# Patient Record
Sex: Male | Born: 1985
Health system: Southern US, Community
[De-identification: ages and names within clinical notes are randomized; demographics above are authoritative.]

---

## 2013-12-06 ENCOUNTER — Ambulatory Visit: Payer: 59 | Admitting: Family

## 2013-12-09 ENCOUNTER — Ambulatory Visit (INDEPENDENT_AMBULATORY_CARE_PROVIDER_SITE_OTHER): Payer: 59 | Admitting: Family

## 2013-12-09 ENCOUNTER — Encounter: Payer: Self-pay | Admitting: Family

## 2013-12-09 VITALS — BP 120/64 | HR 88 | Ht 71.0 in | Wt 170.0 lb

## 2013-12-09 DIAGNOSIS — Z0000 Encounter for general adult medical examination without abnormal findings: Secondary | ICD-10-CM

## 2013-12-09 DIAGNOSIS — Z113 Encounter for screening for infections with a predominantly sexual mode of transmission: Secondary | ICD-10-CM

## 2013-12-09 LAB — COMPREHENSIVE METABOLIC PANEL
ALT: 63 U/L — AB (ref 0–53)
AST: 40 U/L — AB (ref 0–37)
Albumin: 3.7 g/dL (ref 3.5–5.2)
Alkaline Phosphatase: 58 U/L (ref 39–117)
BILIRUBIN TOTAL: 0.4 mg/dL (ref 0.2–1.2)
BUN: 14 mg/dL (ref 6–23)
CO2: 27 mEq/L (ref 19–32)
CREATININE: 1.3 mg/dL (ref 0.4–1.5)
Calcium: 9.6 mg/dL (ref 8.4–10.5)
Chloride: 103 mEq/L (ref 96–112)
GFR: 85.71 mL/min (ref >60.00)
Glucose, Bld: 90 mg/dL (ref 70–99)
Potassium: 3.7 mEq/L (ref 3.5–5.1)
Sodium: 140 mEq/L (ref 135–145)
Total Protein: 7.5 g/dL (ref 6.0–8.3)

## 2013-12-09 LAB — POCT URINALYSIS DIPSTICK
BILIRUBIN UA: NEGATIVE
Blood, UA: NEGATIVE
GLUCOSE UA: NEGATIVE
Leukocytes, UA: NEGATIVE
Nitrite, UA: NEGATIVE
Protein, UA: NEGATIVE
SPEC GRAV UA: 1.02
Urobilinogen, UA: 0.2
pH, UA: 5.5

## 2013-12-09 LAB — CBC WITH DIFFERENTIAL/PLATELET
Basophils Absolute: 0 10*3/uL (ref 0.0–0.1)
Basophils Relative: 0.3 % (ref 0.0–3.0)
Eosinophils Absolute: 0.1 10*3/uL (ref 0.0–0.7)
Eosinophils Relative: 0.7 % (ref 0.0–5.0)
HEMATOCRIT: 43.5 % (ref 39.0–52.0)
HEMOGLOBIN: 14.2 g/dL (ref 13.0–17.0)
LYMPHS ABS: 2.2 10*3/uL (ref 0.7–4.0)
Lymphocytes Relative: 29.2 % (ref 12.0–46.0)
MCHC: 32.8 g/dL (ref 30.0–36.0)
MCV: 93.3 fl (ref 78.0–100.0)
MONOS PCT: 5.5 % (ref 3.0–12.0)
Monocytes Absolute: 0.4 10*3/uL (ref 0.1–1.0)
NEUTROS ABS: 4.8 10*3/uL (ref 1.4–7.7)
Neutrophils Relative %: 64.3 % (ref 43.0–77.0)
Platelets: 278 10*3/uL (ref 150.0–400.0)
RBC: 4.66 Mil/uL (ref 4.22–5.81)
RDW: 12.7 % (ref 11.5–15.5)
WBC: 7.5 10*3/uL (ref 4.0–10.5)

## 2013-12-09 LAB — LIPID PANEL
CHOLESTEROL: 176 mg/dL (ref 0–200)
HDL: 62.2 mg/dL (ref >39.00)
LDL CALC: 93 mg/dL (ref 0–99)
NonHDL: 113.8
TRIGLYCERIDES: 102 mg/dL (ref 0.0–149.0)
Total CHOL/HDL Ratio: 3
VLDL: 20.4 mg/dL (ref 0.0–40.0)

## 2013-12-09 LAB — TSH: TSH: 1.83 u[IU]/mL (ref 0.35–4.50)

## 2013-12-09 NOTE — Patient Instructions (Signed)
Testicular Self-Exam  A self-examination of your testicles involves looking at and feeling your testicles for abnormal lumps or swelling. Several things can cause swelling, lumps, or pain in your testicles. Some of these causes are:  · Injuries.  · Inflammation.  · Infection.  · Accumulation of fluids around your testicle (hydrocele).  · Twisted testicles (testicular torsion).  · Testicular cancer.  Self-examination of the testicles and groin areas may be advised if you are at risk for testicular cancer. Risks for testicular cancer include:  · An undescended testicle (cryptorchidism).  · A history of previous testicular cancer.  · A family history of testicular cancer.  The testicles are easiest to examine after warm baths or showers and are more difficult to examine when you are cold. This is because the muscles attached to the testicles retract and pull them up higher or into the abdomen.  Follow these steps while you are standing:  · Hold your penis away from your body.  · Roll one testicle between your thumb and forefinger, feeling the entire testicle.  · Roll the other testicle between your thumb and forefinger, feeling the entire testicle.  Feel for lumps, swelling, or discomfort. A normal testicle is egg shaped and feels firm. It is smooth and not tender. The spermatic cord can be felt as a firm spaghetti-like cord at the back of your testicle. It is also important to examine the crease between the front of your leg and your abdomen. Feel for any bumps that are tender. These could be enlarged lymph nodes.   Document Released: 05/12/2000 Document Revised: 10/06/2012 Document Reviewed: 07/26/2012  Exit[employer]® Patient Information ©2015 [employer]. This information is not intended to replace advice given to you by your health care provider. Make sure you discuss any questions you have with your health care provider.

## 2013-12-09 NOTE — Progress Notes (Signed)
Pre visit review using our clinic review tool, if applicable. No additional management support is needed unless otherwise documented below in the visit note. 

## 2013-12-09 NOTE — Progress Notes (Signed)
   Subjective:    Patient ID: Andrew Strickland, male    DOB: 1985/09/01, 28 y.o.   MRN: [id number]  HPI  28 year old AAM, nonsmoker is in today for a CPX. He is a Charity fundraiserN at [employer]. Exercises daily. Would like to be screened for HSV2.   Review of Systems  HENT: Negative.   Eyes: Negative.   Respiratory: Negative.   Cardiovascular: Negative.   Gastrointestinal: Negative.   Endocrine: Negative.   Genitourinary: Negative.   Musculoskeletal: Negative.   Skin: Negative.   Allergic/Immunologic: Negative.   Neurological: Negative.   Hematological: Negative.   Psychiatric/Behavioral: Negative.    History reviewed. No pertinent past medical history.  History   Social History  . Marital Status: Single    Spouse Name: N/A    Number of Children: N/A  . Years of Education: N/A   Occupational History  . Not on file.   Social History Main Topics  . Smoking status: Never Smoker   . Smokeless tobacco: Not on file  . Alcohol Use: No  . Drug Use: No  . Sexual Activity: Not on file   Other Topics Concern  . Not on file   Social History Narrative  . No narrative on file    History reviewed. No pertinent past surgical history.  No family history on file.  Allergies  Allergen Reactions  . Codeine   . Penicillins     No current outpatient prescriptions on file prior to visit.   No current facility-administered medications on file prior to visit.    BP 120/64  Pulse 88  Ht 5\' 11"  (1.803 m)  Wt 170 lb (77.111 kg)  BMI 23.72 kg/m2chart    Objective:   Physical Exam  Constitutional: He is oriented to person, place, and time. He appears well-developed and well-nourished.  HENT:  Head: Normocephalic.  Right Ear: External ear normal.  Left Ear: External ear normal.  Nose: Nose normal.  Mouth/Throat: Oropharynx is clear and moist.  Eyes: Conjunctivae and EOM are normal. Pupils are equal, round, and reactive to light.  Neck: Normal range of motion. Neck supple. No  thyromegaly present.  Cardiovascular: Normal rate, regular rhythm and normal heart sounds.   Pulmonary/Chest: Effort normal and breath sounds normal.  Abdominal: Soft. Bowel sounds are normal.  Genitourinary: Penis normal.  Musculoskeletal: Normal range of motion.  Neurological: He is alert and oriented to person, place, and time. He has normal reflexes.  Skin: Skin is warm and dry.  Psychiatric: He has a normal mood and affect.          Assessment & Plan:  Andrew Strickland was seen today for establish care.  Diagnoses and associated orders for this visit:  Preventative health care - CMP - CBC with Differential - TSH - POCT urinalysis dipstick - Lipid panel  Screen for STD (sexually transmitted disease) - HSV 2 Antibody, IgG

## 2013-12-12 LAB — HSV 2 ANTIBODY, IGG: HSV 2 Glycoprotein G Ab, IgG: <0.1 IV

## 2013-12-26 ENCOUNTER — Ambulatory Visit (INDEPENDENT_AMBULATORY_CARE_PROVIDER_SITE_OTHER): Payer: 59

## 2013-12-26 DIAGNOSIS — Z23 Encounter for immunization: Secondary | ICD-10-CM

## 2014-05-23 ENCOUNTER — Encounter (HOSPITAL_COMMUNITY): Payer: Self-pay | Admitting: Emergency Medicine

## 2014-05-23 ENCOUNTER — Emergency Department (HOSPITAL_COMMUNITY)
Admission: EM | Admit: 2014-05-23 | Discharge: 2014-05-23 | Disposition: A | Discharge status: Home / Self Care | Payer: 59 | Source: Home / Self Care | Attending: Family Medicine | Admitting: Family Medicine

## 2014-05-23 DIAGNOSIS — J111 Influenza due to unidentified influenza virus with other respiratory manifestations: Secondary | ICD-10-CM

## 2014-05-23 DIAGNOSIS — IMO0001 Reserved for inherently not codable concepts without codable children: Secondary | ICD-10-CM

## 2014-05-23 DIAGNOSIS — R030 Elevated blood-pressure reading, without diagnosis of hypertension: Secondary | ICD-10-CM | POA: Diagnosis not present

## 2014-05-23 DIAGNOSIS — R69 Illness, unspecified: Principal | ICD-10-CM

## 2014-05-23 MED ORDER — OSELTAMIVIR PHOSPHATE 75 MG PO CAPS: 75.0000 mg | ORAL_CAPSULE | Freq: Two times a day (BID) | ORAL | Status: DC

## 2014-05-23 NOTE — ED Provider Notes (Signed)
CSN: [id number]     Arrival date & time 05/23/14  1819 History   First MD Initiated Contact with Patient 05/23/14 1950     Chief Complaint  Patient presents with  . Influenza   (Consider location/radiation/quality/duration/timing/severity/associated sxs/prior Treatment) HPI Comments: Received flu shot for this season Nonsmoker Reports himself to be otherwise healthy PCP: Adline Mangoampbell, [place]  Patient is a 29 y.o. male presenting with flu symptoms. The history is provided by the patient.  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   History reviewed. No pertinent past medical history. History reviewed. No pertinent past surgical history. History reviewed. No pertinent family history. History  Substance Use Topics  . Smoking status: Never Smoker   . Smokeless tobacco: Never Used  . Alcohol Use: No    Review of Systems  Constitutional: Positive for fatigue.  HENT: Positive for congestion, rhinorrhea and sore throat.   Respiratory: Positive for cough. Negative for chest tightness and shortness of breath.   Cardiovascular: Negative.   Gastrointestinal: Negative.   Musculoskeletal: Positive for myalgias.  Neurological: Positive for headaches.  All other systems reviewed and are negative.   Allergies  Codeine and Penicillins  Home Medications   Prior to Admission medications   Medication Sig Start Date End Date Taking? Authorizing Provider  cetirizine (ZYRTEC) 10 MG tablet Take 10 mg by mouth daily.   Yes Historical Provider, MD  oseltamivir (TAMIFLU) 75 MG capsule Take 1 capsule  (75 mg total) by mouth every 12 (twelve) hours. 05/23/14   Jess BartersJennifer Lee H Amauri Medellin, PA   BP 159/96 mmHg  Pulse 67  Temp(Src) 98.3 F (36.8 C) (Oral)  Resp 20  SpO2 99% Physical Exam  Constitutional: He is oriented to person, place, and time. He appears well-developed and well-nourished. No distress.  HENT:  Head: Normocephalic and atraumatic.  Right Ear: Hearing, tympanic membrane, external ear and ear canal normal.  Left Ear: Hearing, tympanic membrane, external ear and ear canal normal.  Nose: Rhinorrhea present.  Mouth/Throat: Uvula is midline, oropharynx is clear and moist and mucous membranes are normal.  Eyes: Conjunctivae are normal. No scleral icterus.  Neck: Normal range of motion. Neck supple.  Cardiovascular: Normal rate, regular rhythm and normal heart sounds.   Pulmonary/Chest: Effort normal and breath sounds normal. No respiratory distress. He has no wheezes.  Musculoskeletal: Normal range of motion.  Lymphadenopathy:    He has no cervical adenopathy.  Neurological: He is alert and oriented to person, place, and time.  Skin: Skin is warm and dry.  Psychiatric: He has a normal mood and affect. His behavior is normal.  Nursing note and vitals reviewed.   ED Course  Procedures (including critical care time) Labs Review Labs Reviewed - No data to display  Imaging Review No results found.   MDM   1. Influenza-like illness   2. Elevated blood pressure   exam and VS reassuring Mild illness with no underlying co-morbidities Symptomatic care at home tamiflu as directed BP recheck with PCP once illness resolved.   Ria ClockJennifer Lee H Victor Granados, [place]PA 05/23/14 2025

## 2014-05-23 NOTE — ED Notes (Signed)
Pt was recently exposed to someone who tested positive for the flu.  He has been suffering from a cough, sneezing, nasal congestion, headache and sore throat for about 24 hours.  Pt denies any headache.

## 2014-05-23 NOTE — Discharge Instructions (Signed)
As we discussed, once your illness has resolved, please have your blood pressure re-checked as it was elevated at today's visit.  Influenza Influenza ("the flu") is a viral infection of the respiratory tract. It occurs more often in winter months because people spend more time in close contact with one another. Influenza can make you feel very sick. Influenza easily spreads from person to person (contagious). CAUSES  Influenza is caused by a virus that infects the respiratory tract. You can catch the virus by breathing in droplets from an infected person's cough or sneeze. You can also catch the virus by touching something that was recently contaminated with the virus and then touching your mouth, nose, or eyes. RISKS AND COMPLICATIONS You may be at risk for a more severe case of influenza if you smoke cigarettes, have diabetes, have chronic heart disease (such as heart failure) or lung disease (such as asthma), or if you have a weakened immune system. Elderly people and pregnant women are also at risk for more serious infections. The most common problem of influenza is a lung infection (pneumonia). Sometimes, this problem can require emergency medical care and may be life threatening. SIGNS AND SYMPTOMS  Symptoms typically last 4 to 10 days and may include:  Fever.  Chills.  Headache, body aches, and muscle aches.  Sore throat.  Chest discomfort and cough.  Poor appetite.  Weakness or feeling tired.  Dizziness.  Nausea or vomiting. DIAGNOSIS  Diagnosis of influenza is often made based on your history and a physical exam. A nose or throat swab test can be done to confirm the diagnosis. TREATMENT  In mild cases, influenza goes away on its own. Treatment is directed at relieving symptoms. For more severe cases, your health care provider may prescribe antiviral medicines to shorten the sickness. Antibiotic medicines are not effective because the infection is caused by a virus, not by  bacteria. HOME CARE INSTRUCTIONS  Take medicines only as directed by your health care provider.  Use a cool mist humidifier to make breathing easier.  Get plenty of rest until your temperature returns to normal. This usually takes 3 to 4 days.  Drink enough fluid to keep your urine clear or pale yellow.  Cover yourmouth and nosewhen coughing or sneezing,and wash your handswellto prevent thevirusfrom spreading.  Stay homefromwork orschool untilthe fever is gonefor at least 30full day. PREVENTION  An annual influenza vaccination (flu shot) is the best way to avoid getting influenza. An annual flu shot is now routinely recommended for all adults in the [place] SEEK MEDICAL CARE IF:  You experiencechest pain, yourcough worsens,or you producemore mucus.  Youhave nausea,vomiting, ordiarrhea.  Your fever returns or gets worse. SEEK IMMEDIATE MEDICAL CARE IF:  You havetrouble breathing, you become short of breath,or your skin ornails becomebluish.  You have severe painor stiffnessin the neck.  You develop a sudden headache, or pain in the face or ear.  You have nausea or vomiting that you cannot control. MAKE SURE YOU:   Understand these instructions.  Will watch your condition.  Will get help right away if you are not doing well or get worse. Document Released: 02/01/2000 Document Revised: 06/20/2013 Document Reviewed: 05/05/2011 [facility] Patient Information 2015 [place], [place]. This information is not intended to replace advice given to you by your health care provider. Make sure you discuss any questions you have with your health care provider.  Upper Respiratory Infection, Adult An upper respiratory infection (URI) is also sometimes known as the common cold. The  upper respiratory tract includes the nose, sinuses, throat, trachea, and bronchi. Bronchi are the airways leading to the lungs. Most people improve within 1 week, but symptoms can last up to 2  weeks. A residual cough may last even longer.  CAUSES Many different viruses can infect the tissues lining the upper respiratory tract. The tissues become irritated and inflamed and often become very moist. Mucus production is also common. A cold is contagious. You can easily spread the virus to others by oral contact. This includes kissing, sharing a glass, coughing, or sneezing. Touching your mouth or nose and then touching a surface, which is then touched by another person, can also spread the virus. SYMPTOMS  Symptoms typically develop 1 to 3 days after you come in contact with a cold virus. Symptoms vary from person to person. They may include:  Runny nose.  Sneezing.  Nasal congestion.  Sinus irritation.  Sore throat.  Loss of voice (laryngitis).  Cough.  Fatigue.  Muscle aches.  Loss of appetite.  Headache.  Low-grade fever. DIAGNOSIS  You might diagnose your own cold based on familiar symptoms, since most people get a cold 2 to 3 times a year. Your caregiver can confirm this based on your exam. Most importantly, your caregiver can check that your symptoms are not due to another disease such as strep throat, sinusitis, pneumonia, asthma, or epiglottitis. Blood tests, throat tests, and X-rays are not necessary to diagnose a common cold, but they may sometimes be helpful in excluding other more serious diseases. Your caregiver will decide if any further tests are required. RISKS AND COMPLICATIONS  You may be at risk for a more severe case of the common cold if you smoke cigarettes, have chronic heart disease (such as heart failure) or lung disease (such as asthma), or if you have a weakened immune system. The very young and very old are also at risk for more serious infections. Bacterial sinusitis, middle ear infections, and bacterial pneumonia can complicate the common cold. The common cold can worsen asthma and chronic obstructive pulmonary disease (COPD). Sometimes, these  complications can require emergency medical care and may be life-threatening. PREVENTION  The best way to protect against getting a cold is to practice good hygiene. Avoid oral or hand contact with people with cold symptoms. Wash your hands often if contact occurs. There is no clear evidence that vitamin C, vitamin E, echinacea, or exercise reduces the chance of developing a cold. However, it is always recommended to get plenty of rest and practice good nutrition. TREATMENT  Treatment is directed at relieving symptoms. There is no cure. Antibiotics are not effective, because the infection is caused by a virus, not by bacteria. Treatment may include:  Increased fluid intake. Sports drinks offer valuable electrolytes, sugars, and fluids.  Breathing heated mist or steam (vaporizer or shower).  Eating chicken soup or other clear broths, and maintaining good nutrition.  Getting plenty of rest.  Using gargles or lozenges for comfort.  Controlling fevers with ibuprofen or acetaminophen as directed by your caregiver.  Increasing usage of your inhaler if you have asthma. Zinc gel and zinc lozenges, taken in the first 24 hours of the common cold, can shorten the duration and lessen the severity of symptoms. Pain medicines may help with fever, muscle aches, and throat pain. A variety of non-prescription medicines are available to treat congestion and runny nose. Your caregiver can make recommendations and may suggest nasal or lung inhalers for other symptoms.  HOME CARE INSTRUCTIONS  Only take over-the-counter or prescription medicines for pain, discomfort, or fever as directed by your caregiver.  Use a warm mist humidifier or inhale steam from a shower to increase air moisture. This may keep secretions moist and make it easier to breathe.  Drink enough water and fluids to keep your urine clear or pale yellow.  Rest as needed.  Return to work when your temperature has returned to normal or as  your caregiver advises. You may need to stay home longer to avoid infecting others. You can also use a face mask and careful hand washing to prevent spread of the virus. SEEK MEDICAL CARE IF:   After the first few days, you feel you are getting worse rather than better.  You need your caregiver's advice about medicines to control symptoms.  You develop chills, worsening shortness of breath, or brown or red sputum. These may be signs of pneumonia.  You develop yellow or brown nasal discharge or pain in the face, especially when you bend forward. These may be signs of sinusitis.  You develop a fever, swollen neck glands, pain with swallowing, or white areas in the back of your throat. These may be signs of strep throat. SEEK IMMEDIATE MEDICAL CARE IF:   You have a fever.  You develop severe or persistent headache, ear pain, sinus pain, or chest pain.  You develop wheezing, a prolonged cough, cough up blood, or have a change in your usual mucus (if you have chronic lung disease).  You develop sore muscles or a stiff neck. Document Released: 07/30/2000 Document Revised: 04/28/2011 Document Reviewed: 05/11/2013 [facility] Patient Information 2015 [place], [place]. This information is not intended to replace advice given to you by your health care provider. Make sure you discuss any questions you have with your health care provider.

## 2014-06-15 ENCOUNTER — Encounter: Payer: Self-pay | Admitting: Podiatry

## 2014-06-15 ENCOUNTER — Ambulatory Visit (INDEPENDENT_AMBULATORY_CARE_PROVIDER_SITE_OTHER): Payer: 59 | Admitting: Podiatry

## 2014-06-15 ENCOUNTER — Ambulatory Visit (INDEPENDENT_AMBULATORY_CARE_PROVIDER_SITE_OTHER): Payer: 59

## 2014-06-15 VITALS — BP 120/76 | HR 66 | Resp 16

## 2014-06-15 DIAGNOSIS — M2142 Flat foot [pes planus] (acquired), left foot: Secondary | ICD-10-CM | POA: Diagnosis not present

## 2014-06-15 DIAGNOSIS — M79673 Pain in unspecified foot: Secondary | ICD-10-CM | POA: Diagnosis not present

## 2014-06-15 DIAGNOSIS — M2141 Flat foot [pes planus] (acquired), right foot: Secondary | ICD-10-CM | POA: Diagnosis not present

## 2014-06-15 DIAGNOSIS — M722 Plantar fascial fibromatosis: Secondary | ICD-10-CM | POA: Diagnosis not present

## 2014-06-15 NOTE — Progress Notes (Signed)
   Subjective:    Patient ID: Andrew Strickland, male    DOB: Dec 05, 1985, 29 y.o.   MRN: [id number]  HPI Comments: "I have pain in the feet"  Patient c/o aching plantar bilateral, especially arch, for several years. Has gotten worse over time. He has flat feet and has tried OTC insoles-initially helped but not anymore. Works 12 hr shifts.  Foot Pain      Review of Systems  All other systems reviewed and are negative.      Objective:   Physical Exam: I have reviewed his past medical history medications allergies surgery social history and review of systems. Pulses are strongly palpable bilateral. Neurologic sensorium is intact percent joint C monofilament. Deep tendon reflexes are intact bilaterally muscle strength +5 over 5 dorsiflexion plantar flexors and inverters everters all intrinsic musculature is intact. He does have flexible pes planus bilaterally with the foot appears to be in good rectus position with exception of mild gastroc equinus as well as a mild hallux valgus deformity. Radiographs confirm pes planus as well as hallux valgus no osteoarthritic changes as of yet. Cutaneous evaluation and history supple well-hydrated cutis no erythema or edema cellulitis drainage or odor.        Assessment & Plan:  Assessment: Pes planus/fasciitis/hallux valgus bilateral.  Plan: We discussed the etiology pathology conservative versus surgical therapies. At this point I believe the best thing for him would be a pair of custom build orthotics we will follow up with him once these are in our office.

## 2014-09-04 ENCOUNTER — Telehealth: Payer: Self-pay | Admitting: Podiatry

## 2014-09-04 NOTE — Telephone Encounter (Signed)
Patient wants to know if insurance has paid any on his orthotics that he was scanned for on 06/15/2014. Both Charity and I tried to read but could not figure out. I told him right now that he has a $0.00 balance. He requested that you call him please to let him know what is going on. Thank you.

## 2014-09-22 ENCOUNTER — Ambulatory Visit: Payer: 59 | Admitting: *Deleted

## 2014-09-22 DIAGNOSIS — M722 Plantar fascial fibromatosis: Secondary | ICD-10-CM

## 2014-09-22 NOTE — Progress Notes (Signed)
Patient ID: Andrew Strickland, male   DOB: July 30, 1985, 29 y.o.   MRN: [id number] Patient presents for orthotic pick up.  Verbal and written break in and wear instructions given.  Patient will follow up in 4 weeks if symptoms worsen or fail to improve.

## 2014-09-22 NOTE — Patient Instructions (Signed)

## 2014-10-17 ENCOUNTER — Ambulatory Visit: Payer: 59 | Admitting: Adult Health

## 2014-12-01 ENCOUNTER — Ambulatory Visit: Payer: 59 | Admitting: Adult Health

## 2014-12-18 ENCOUNTER — Other Ambulatory Visit (HOSPITAL_COMMUNITY)
Admission: RE | Admit: 2014-12-18 | Discharge: 2014-12-18 | Disposition: A | Discharge status: Home / Self Care | Payer: 59 | Source: Ambulatory Visit | Attending: Adult Health | Admitting: Adult Health

## 2014-12-18 ENCOUNTER — Encounter: Payer: Self-pay | Admitting: Adult Health

## 2014-12-18 ENCOUNTER — Ambulatory Visit (INDEPENDENT_AMBULATORY_CARE_PROVIDER_SITE_OTHER): Payer: 59 | Admitting: Adult Health

## 2014-12-18 VITALS — BP 124/76 | Temp 98.2°F | Ht 71.0 in | Wt 178.2 lb

## 2014-12-18 DIAGNOSIS — Z0000 Encounter for general adult medical examination without abnormal findings: Secondary | ICD-10-CM

## 2014-12-18 DIAGNOSIS — Z113 Encounter for screening for infections with a predominantly sexual mode of transmission: Secondary | ICD-10-CM | POA: Diagnosis not present

## 2014-12-18 LAB — CBC WITH DIFFERENTIAL/PLATELET
Basophils Absolute: 0 10*3/uL (ref 0.0–0.1)
Basophils Relative: 0.2 % (ref 0.0–3.0)
EOS PCT: 0.5 % (ref 0.0–5.0)
Eosinophils Absolute: 0 10*3/uL (ref 0.0–0.7)
HCT: 43.4 % (ref 39.0–52.0)
Hemoglobin: 14.3 g/dL (ref 13.0–17.0)
Lymphocytes Relative: 22.7 % (ref 12.0–46.0)
Lymphs Abs: 1.8 10*3/uL (ref 0.7–4.0)
MCHC: 33 g/dL (ref 30.0–36.0)
MCV: 92.2 fl (ref 78.0–100.0)
MONOS PCT: 5.7 % (ref 3.0–12.0)
Monocytes Absolute: 0.5 10*3/uL (ref 0.1–1.0)
NEUTROS ABS: 5.7 10*3/uL (ref 1.4–7.7)
Neutrophils Relative %: 70.9 % (ref 43.0–77.0)
Platelets: 270 10*3/uL (ref 150.0–400.0)
RBC: 4.7 Mil/uL (ref 4.22–5.81)
RDW: 12.6 % (ref 11.5–15.5)
WBC: 8.1 10*3/uL (ref 4.0–10.5)

## 2014-12-18 LAB — POCT URINALYSIS DIPSTICK
Bilirubin, UA: NEGATIVE
Blood, UA: NEGATIVE
Glucose, UA: NEGATIVE
Ketones, UA: NEGATIVE
LEUKOCYTES UA: NEGATIVE
NITRITE UA: NEGATIVE
Spec Grav, UA: >=1.03
UROBILINOGEN UA: 0.2
pH, UA: 6

## 2014-12-18 LAB — BASIC METABOLIC PANEL
BUN: 13 mg/dL (ref 6–23)
CO2: 27 meq/L (ref 19–32)
Calcium: 9.9 mg/dL (ref 8.4–10.5)
Chloride: 99 mEq/L (ref 96–112)
Creatinine, Ser: 1.05 mg/dL (ref 0.40–1.50)
GFR: 107.88 mL/min (ref >60.00)
GLUCOSE: 103 mg/dL — AB (ref 70–99)
POTASSIUM: 3.3 meq/L — AB (ref 3.5–5.1)
SODIUM: 138 meq/L (ref 135–145)

## 2014-12-18 LAB — HEPATIC FUNCTION PANEL
ALK PHOS: 58 U/L (ref 39–117)
ALT: 53 U/L (ref 0–53)
AST: 32 U/L (ref 0–37)
Albumin: 4.3 g/dL (ref 3.5–5.2)
Bilirubin, Direct: 0.1 mg/dL (ref 0.0–0.3)
Total Bilirubin: 0.4 mg/dL (ref 0.2–1.2)
Total Protein: 8.1 g/dL (ref 6.0–8.3)

## 2014-12-18 LAB — LIPID PANEL
CHOLESTEROL: 171 mg/dL (ref 0–200)
HDL: 63.2 mg/dL (ref >39.00)
LDL Cholesterol: 95 mg/dL (ref 0–99)
NonHDL: 107.92
Total CHOL/HDL Ratio: 3
Triglycerides: 67 mg/dL (ref 0.0–149.0)
VLDL: 13.4 mg/dL (ref 0.0–40.0)

## 2014-12-18 LAB — TSH: TSH: 1.3 u[IU]/mL (ref 0.35–4.50)

## 2014-12-18 LAB — HEMOGLOBIN A1C: Hgb A1c MFr Bld: 5.4 % (ref 4.6–6.5)

## 2014-12-18 NOTE — Patient Instructions (Addendum)
It was great meeting you today!  I will follow up with you regarding your blood work. Follow up with me in one year or sooner if needed.   Health Maintenance, Male A healthy lifestyle and preventative care can promote health and wellness.  Maintain regular health, dental, and eye exams.  Eat a healthy diet. Foods like vegetables, fruits, whole grains, low-fat dairy products, and lean protein foods contain the nutrients you need and are low in calories. Decrease your intake of foods high in solid fats, added sugars, and salt. Get information about a proper diet from your health care provider, if necessary.  Regular physical exercise is one of the most important things you can do for your health. Most adults should get at least 150 minutes of moderate-intensity exercise (any activity that increases your heart rate and causes you to sweat) each week. In addition, most adults need muscle-strengthening exercises on 2 or more days a week.   Maintain a healthy weight. The body mass index (BMI) is a screening tool to identify possible weight problems. It provides an estimate of body fat based on height and weight. Your health care provider can find your BMI and can help you achieve or maintain a healthy weight. For males 20 years and older:  A BMI below 18.5 is considered underweight.  A BMI of 18.5 to 24.9 is normal.  A BMI of 25 to 29.9 is considered overweight.  A BMI of 30 and above is considered obese.  Maintain normal blood lipids and cholesterol by exercising and minimizing your intake of saturated fat. Eat a balanced diet with plenty of fruits and vegetables. Blood tests for lipids and cholesterol should begin at age 28 and be repeated every 5 years. If your lipid or cholesterol levels are high, you are over age 28, or you are at high risk for heart disease, you may need your cholesterol levels checked more frequently.Ongoing high lipid and cholesterol levels should be treated with medicines  if diet and exercise are not working.  If you smoke, find out from your health care provider how to quit. If you do not use tobacco, do not start.  Lung cancer screening is recommended for adults aged 55-80 years who are at high risk for developing lung cancer because of a history of smoking. A yearly low-dose CT scan of the lungs is recommended for people who have at least a 30-pack-year history of smoking and are current smokers or have quit within the past 15 years. A pack year of smoking is smoking an average of 1 pack of cigarettes a day for 1 year (for example, a 30-pack-year history of smoking could mean smoking 1 pack a day for 30 years or 2 packs a day for 15 years). Yearly screening should continue until the smoker has stopped smoking for at least 15 years. Yearly screening should be stopped for people who develop a health problem that would prevent them from having lung cancer treatment.  If you choose to drink alcohol, do not have more than 2 drinks per day. One drink is considered to be 12 oz (360 mL) of beer, 5 oz (150 mL) of wine, or 1.5 oz (45 mL) of liquor.  Avoid the use of street drugs. Do not share needles with anyone. Ask for help if you need support or instructions about stopping the use of drugs.  High blood pressure causes heart disease and increases the risk of stroke. High blood pressure is more likely to develop in:  People who have blood pressure in the end of the normal range (100-139/85-89 mm Hg).  People who are overweight or obese.  People who are African American.  If you are 60-4 years of age, have your blood pressure checked every 3-5 years. If you are 65 years of age or older, have your blood pressure checked every year. You should have your blood pressure measured twice--once when you are at a hospital or clinic, and once when you are not at a hospital or clinic. Record the average of the two measurements. To check your blood pressure when you are not at a  hospital or clinic, you can use:  An automated blood pressure machine at a pharmacy.  A home blood pressure monitor.  If you are 12-49 years old, ask your health care provider if you should take aspirin to prevent heart disease.  Diabetes screening involves taking a blood sample to check your fasting blood sugar level. This should be done once every 3 years after age 42 if you are at a normal weight and without risk factors for diabetes. Testing should be considered at a younger age or be carried out more frequently if you are overweight and have at least 1 risk factor for diabetes.  Colorectal cancer can be detected and often prevented. Most routine colorectal cancer screening begins at the age of 56 and continues through age 41. However, your health care provider may recommend screening at an earlier age if you have risk factors for colon cancer. On a yearly basis, your health care provider may provide home test kits to check for hidden blood in the stool. A small camera at the end of a tube may be used to directly examine the colon (sigmoidoscopy or colonoscopy) to detect the earliest forms of colorectal cancer. Talk to your health care provider about this at age 84 when routine screening begins. A direct exam of the colon should be repeated every 5-10 years through age 60, unless early forms of precancerous polyps or small growths are found.  People who are at an increased risk for hepatitis B should be screened for this virus. You are considered at high risk for hepatitis B if:  You were born in a country where hepatitis B occurs often. Talk with your health care provider about which countries are considered high risk.  Your parents were born in a high-risk country and you have not received a shot to protect against hepatitis B (hepatitis B vaccine).  You have HIV or AIDS.  You use needles to inject street drugs.  You live with, or have sex with, someone who has hepatitis B.  You are a  man who has sex with other men (MSM).  You get hemodialysis treatment.  You take certain medicines for conditions like cancer, organ transplantation, and autoimmune conditions.  Hepatitis C blood testing is recommended for all people born from [birth year] through 1965 and any individual with known risk factors for hepatitis C.  Healthy men should no longer receive prostate-specific antigen (PSA) blood tests as part of routine cancer screening. Talk to your health care provider about prostate cancer screening.  Testicular cancer screening is not recommended for adolescents or adult males who have no symptoms. Screening includes self-exam, a health care provider exam, and other screening tests. Consult with your health care provider about any symptoms you have or any concerns you have about testicular cancer.  Practice safe sex. Use condoms and avoid high-risk sexual practices to reduce the spread of  sexually transmitted infections (STIs).  You should be screened for STIs, including gonorrhea and chlamydia if:  You are sexually active and are younger than 24 years.  You are older than 24 years, and your health care provider tells you that you are at risk for this type of infection.  Your sexual activity has changed since you were last screened, and you are at an increased risk for chlamydia or gonorrhea. Ask your health care provider if you are at risk.  If you are at risk of being infected with HIV, it is recommended that you take a prescription medicine daily to prevent HIV infection. This is called pre-exposure prophylaxis (PrEP). You are considered at risk if:  You are a man who has sex with other men (MSM).  You are a heterosexual man who is sexually active with multiple partners.  You take drugs by injection.  You are sexually active with a partner who has HIV.  Talk with your health care provider about whether you are at high risk of being infected with HIV. If you choose to begin PrEP,  you should first be tested for HIV. You should then be tested every 3 months for as long as you are taking PrEP.  Use sunscreen. Apply sunscreen liberally and repeatedly throughout the day. You should seek shade when your shadow is shorter than you. Protect yourself by wearing long sleeves, pants, a wide-brimmed hat, and sunglasses year round whenever you are outdoors.  Tell your health care provider of new moles or changes in moles, especially if there is a change in shape or color. Also, tell your health care provider if a mole is larger than the size of a pencil eraser.  A one-time screening for abdominal aortic aneurysm (AAA) and surgical repair of large AAAs by ultrasound is recommended for men aged 78-75 years who are current or former smokers.  Stay current with your vaccines (immunizations).   This information is not intended to replace advice given to you by your health care provider. Make sure you discuss any questions you have with your health care provider.   Document Released: 08/02/2007 Document Revised: 02/24/2014 Document Reviewed: 07/01/2010 Elsevier Interactive Patient Education [employer].

## 2014-12-18 NOTE — Progress Notes (Signed)
Pre visit review using our clinic review tool, if applicable. No additional management support is needed unless otherwise documented below in the visit note. 

## 2014-12-18 NOTE — Progress Notes (Signed)
HPI:  Andrew Strickland is here to establish care and for his complete physical. He  has no past medical history on file. He is a extremely healthy AA male. 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  Immunizations:UTD  Diet: Chicken and fish Exercise:3-4 days a week in the gym   No past medical history on file.  No past surgical history on file.  No family history on file.  Social History   Social History  . Marital Status: Single    Spouse Name: N/A  . Number of Children: N/A  . Years of Education: N/A   Social History Main Topics  . Smoking status: Never Smoker   . Smokeless tobacco: Never Used  . Alcohol Use: No  . Drug Use: No  . Sexual Activity: Not Asked   Other Topics Concern  . None   Social History Narrative     Current outpatient prescriptions:  .  cetirizine (ZYRTEC) 10 MG tablet, Take 10 mg by mouth daily., Disp: , Rfl:  .  Doxycycline Monohydrate (VIBRAMYCIN PO), Take by mouth., Disp: , Rfl:  .  clindamycin (CLEOCIN T) 1 % external solution, , Disp: , Rfl: 11 .  tretinoin (RETIN-A) 0.025 % cream, , Disp: , Rfl: 5  EXAM:  Filed Vitals:   12/18/14 0848  BP: 124/76  Temp: 98.2 F (36.8 C)    Body mass index is 24.86 kg/(m^2).  GENERAL: vitals reviewed and listed above, alert, oriented, appears well hydrated and in no acute distress  HEENT: atraumatic, conjunttiva clear, no obvious abnormalities on inspection of external  nose and ears  NECK: Neck is soft and supple without masses, no adenopathy or thyromegaly, trachea midline, no JVD. Normal range of motion.   LUNGS: clear to auscultation bilaterally, no wheezes, rales or rhonchi, good air movement  CV: Regular rate and rhythm, normal S1/S2, no audible murmurs, gallops, or rubs. No carotid bruit and no peripheral edema.   MS: moves all extremities without noticeable abnormality. No edema noted  Abd: soft/nontender/nondistended/normal bowel sounds   Skin: warm and dry, no rash   Extremities: No clubbing, cyanosis, or edema. Capillary refill is WNL. Pulses intact bilaterally in upper and lower extremities.   Neuro: CN II-XII intact, sensation and reflexes normal throughout, 5/5 muscle strength in bilateral upper and lower extremities. Normal finger to nose. Normal rapid alternating movements. Normal romberg. No pronator drift.   PSYCH: pleasant and cooperative, no obvious depression or anxiety  ASSESSMENT AND PLAN: 1. Routine general medical examination at a health care facility - Basic metabolic panel - CBC with Differential/Platelet - Hemoglobin A1c - Hepatic function panel - Lipid panel - POCT urinalysis dipstick - TSH - Acute Hep Panel & Hep B Surface Ab - HIV antibody - HSV(herpes smplx)abs-1+2(IgG+IgM)-bld - RPR - Follow up with me in one year or sooner if needed.  - Urine cytology ancillary only   -We reviewed the PMH, PSH, FH, SH, Meds and Allergies. -We provided refills for any medications we will prescribe as needed. -We addressed  current concerns per orders and patient instructions. -We have asked for records for pertinent exams, studies, vaccines and notes from previous providers. -We have advised patient to follow up per instructions below.   -Patient advised to return or notify a provider immediately if symptoms worsen or persist or new concerns arise.    Shirline Frees, AGNP

## 2014-12-19 LAB — HSV(HERPES SMPLX)ABS-I+II(IGG+IGM)-BLD
HERPES SIMPLEX VRS I-IGM AB (EIA): 4.61 {index} — AB
HSV 1 Glycoprotein G Ab, IgG: 6.13 IV — ABNORMAL HIGH
HSV 2 Glycoprotein G Ab, IgG: 0.12 IV

## 2014-12-19 LAB — ACUTE HEP PANEL AND HEP B SURFACE AB
HCV Ab: NEGATIVE
HEP B C IGM: NONREACTIVE
HEP B S AB: POSITIVE — AB
HEP B S AG: NEGATIVE
Hep A IgM: NONREACTIVE

## 2014-12-19 LAB — URINE CYTOLOGY ANCILLARY ONLY
CHLAMYDIA, DNA PROBE: NEGATIVE
NEISSERIA GONORRHEA: NEGATIVE
TRICH (WINDOWPATH): NEGATIVE

## 2014-12-19 LAB — RPR

## 2014-12-19 LAB — HIV ANTIBODY (ROUTINE TESTING W REFLEX): HIV: NONREACTIVE

## 2015-02-26 MED FILL — CLINDAMYCIN PH 1% SOLUTION: 1 | 30 days supply | Qty: 60 | Fill #6

## 2015-03-16 DIAGNOSIS — H5213 Myopia, bilateral: Secondary | ICD-10-CM | POA: Diagnosis not present

## 2015-03-28 DIAGNOSIS — Z79899 Other long term (current) drug therapy: Secondary | ICD-10-CM | POA: Diagnosis not present

## 2015-03-28 DIAGNOSIS — L700 Acne vulgaris: Secondary | ICD-10-CM | POA: Diagnosis not present

## 2015-03-28 DIAGNOSIS — L731 Pseudofolliculitis barbae: Secondary | ICD-10-CM | POA: Diagnosis not present

## 2015-03-28 DIAGNOSIS — L810 Postinflammatory hyperpigmentation: Secondary | ICD-10-CM | POA: Diagnosis not present

## 2015-03-28 MED FILL — MYORISAN 40 MG CAPSULE: 40 | 30 days supply | Qty: 60 | Fill #0

## 2015-04-04 MED FILL — CLINDAMYCIN PH 1% SOLUTION: 1 | 30 days supply | Qty: 60 | Fill #7

## 2015-05-07 MED FILL — CLINDAMYCIN PH 1% SOLUTION: 1 | 30 days supply | Qty: 60 | Fill #8

## 2015-05-11 DIAGNOSIS — L739 Follicular disorder, unspecified: Secondary | ICD-10-CM | POA: Diagnosis not present

## 2015-05-11 DIAGNOSIS — Z79899 Other long term (current) drug therapy: Secondary | ICD-10-CM | POA: Diagnosis not present

## 2015-05-11 DIAGNOSIS — L731 Pseudofolliculitis barbae: Secondary | ICD-10-CM | POA: Diagnosis not present

## 2015-05-11 DIAGNOSIS — L700 Acne vulgaris: Secondary | ICD-10-CM | POA: Diagnosis not present

## 2015-05-11 DIAGNOSIS — T50995A Adverse effect of other drugs, medicaments and biological substances, initial encounter: Secondary | ICD-10-CM | POA: Diagnosis not present

## 2015-05-11 DIAGNOSIS — L810 Postinflammatory hyperpigmentation: Secondary | ICD-10-CM | POA: Diagnosis not present

## 2015-05-11 MED FILL — MYORISAN 40 MG CAPSULE: 40 | 30 days supply | Qty: 60 | Fill #0

## 2015-08-13 DIAGNOSIS — L731 Pseudofolliculitis barbae: Secondary | ICD-10-CM | POA: Diagnosis not present

## 2015-08-13 DIAGNOSIS — L810 Postinflammatory hyperpigmentation: Secondary | ICD-10-CM | POA: Diagnosis not present

## 2015-08-13 DIAGNOSIS — Z79899 Other long term (current) drug therapy: Secondary | ICD-10-CM | POA: Diagnosis not present

## 2015-08-13 DIAGNOSIS — L700 Acne vulgaris: Secondary | ICD-10-CM | POA: Diagnosis not present

## 2015-08-13 DIAGNOSIS — Z5181 Encounter for therapeutic drug level monitoring: Secondary | ICD-10-CM | POA: Diagnosis not present

## 2015-08-13 MED FILL — CLINDAMYCIN PH 1% SOLUTION: 1 | 30 days supply | Qty: 120 | Fill #0

## 2015-08-13 MED FILL — MYORISAN 40 MG CAPSULE: 40 | 30 days supply | Qty: 60 | Fill #0

## 2015-09-25 DIAGNOSIS — Z79899 Other long term (current) drug therapy: Secondary | ICD-10-CM | POA: Diagnosis not present

## 2015-09-25 DIAGNOSIS — L700 Acne vulgaris: Secondary | ICD-10-CM | POA: Diagnosis not present

## 2015-10-01 MED FILL — MYORISAN 40 MG CAPSULE: 40 | 30 days supply | Qty: 60 | Fill #0

## 2015-10-25 DIAGNOSIS — L700 Acne vulgaris: Secondary | ICD-10-CM | POA: Diagnosis not present

## 2015-10-25 DIAGNOSIS — Z79899 Other long term (current) drug therapy: Secondary | ICD-10-CM | POA: Diagnosis not present

## 2015-10-26 MED FILL — MYORISAN 40 MG CAPSULE: 40 | 30 days supply | Qty: 60 | Fill #0

## 2015-11-23 DIAGNOSIS — Z79899 Other long term (current) drug therapy: Secondary | ICD-10-CM | POA: Diagnosis not present

## 2015-11-23 DIAGNOSIS — L700 Acne vulgaris: Secondary | ICD-10-CM | POA: Diagnosis not present

## 2015-11-23 DIAGNOSIS — L731 Pseudofolliculitis barbae: Secondary | ICD-10-CM | POA: Diagnosis not present

## 2015-11-23 MED FILL — MYORISAN 40 MG CAPSULE: 40 | 30 days supply | Qty: 60 | Fill #0

## 2015-12-05 MED FILL — CLINDAMYCIN PH 1% SOLUTION: 1 | 30 days supply | Qty: 60 | Fill #0

## 2015-12-27 DIAGNOSIS — L700 Acne vulgaris: Secondary | ICD-10-CM | POA: Diagnosis not present

## 2015-12-27 DIAGNOSIS — Z79899 Other long term (current) drug therapy: Secondary | ICD-10-CM | POA: Diagnosis not present

## 2015-12-27 MED FILL — MYORISAN 40 MG CAPSULE: 40 | 30 days supply | Qty: 60 | Fill #0

## 2016-01-03 MED FILL — CLINDAMYCIN PH 1% SOLUTION: 1 | 30 days supply | Qty: 60 | Fill #1

## 2016-06-03 MED FILL — CLINDAMYCIN PH 1% SOLUTION: 1 | 30 days supply | Qty: 60 | Fill #2

## 2016-08-07 ENCOUNTER — Ambulatory Visit (INDEPENDENT_AMBULATORY_CARE_PROVIDER_SITE_OTHER): Payer: No Typology Code available for payment source | Admitting: Adult Health

## 2016-08-07 ENCOUNTER — Other Ambulatory Visit (HOSPITAL_COMMUNITY)
Admission: RE | Admit: 2016-08-07 | Discharge: 2016-08-07 | Disposition: A | Discharge status: Home / Self Care | Payer: No Typology Code available for payment source | Source: Ambulatory Visit | Attending: Adult Health | Admitting: Adult Health

## 2016-08-07 ENCOUNTER — Encounter: Payer: Self-pay | Admitting: Adult Health

## 2016-08-07 VITALS — BP 138/80 | Temp 98.6°F | Ht 71.0 in | Wt 189.3 lb

## 2016-08-07 DIAGNOSIS — Z0000 Encounter for general adult medical examination without abnormal findings: Secondary | ICD-10-CM | POA: Diagnosis not present

## 2016-08-07 DIAGNOSIS — Z202 Contact with and (suspected) exposure to infections with a predominantly sexual mode of transmission: Secondary | ICD-10-CM | POA: Diagnosis not present

## 2016-08-07 LAB — LIPID PANEL
CHOL/HDL RATIO: 3
CHOLESTEROL: 139 mg/dL (ref 0–200)
HDL: 53.7 mg/dL (ref >39.00)
LDL Cholesterol: 76 mg/dL (ref 0–99)
NonHDL: 84.86
TRIGLYCERIDES: 43 mg/dL (ref 0.0–149.0)
VLDL: 8.6 mg/dL (ref 0.0–40.0)

## 2016-08-07 LAB — HEPATIC FUNCTION PANEL
ALBUMIN: 4.5 g/dL (ref 3.5–5.2)
ALK PHOS: 63 U/L (ref 39–117)
ALT: 41 U/L (ref 0–53)
AST: 28 U/L (ref 0–37)
Bilirubin, Direct: 0.1 mg/dL (ref 0.0–0.3)
TOTAL PROTEIN: 7.5 g/dL (ref 6.0–8.3)
Total Bilirubin: 0.5 mg/dL (ref 0.2–1.2)

## 2016-08-07 LAB — CBC WITH DIFFERENTIAL/PLATELET
Basophils Absolute: 0 10*3/uL (ref 0.0–0.1)
Basophils Relative: 0.1 % (ref 0.0–3.0)
EOS ABS: 0 10*3/uL (ref 0.0–0.7)
Eosinophils Relative: 0.5 % (ref 0.0–5.0)
HEMATOCRIT: 43.8 % (ref 39.0–52.0)
HEMOGLOBIN: 14.7 g/dL (ref 13.0–17.0)
LYMPHS PCT: 27.4 % (ref 12.0–46.0)
Lymphs Abs: 1.8 10*3/uL (ref 0.7–4.0)
MCHC: 33.5 g/dL (ref 30.0–36.0)
MCV: 90.7 fl (ref 78.0–100.0)
MONO ABS: 0.4 10*3/uL (ref 0.1–1.0)
Monocytes Relative: 6.3 % (ref 3.0–12.0)
Neutro Abs: 4.3 10*3/uL (ref 1.4–7.7)
Neutrophils Relative %: 65.7 % (ref 43.0–77.0)
Platelets: 267 10*3/uL (ref 150.0–400.0)
RBC: 4.83 Mil/uL (ref 4.22–5.81)
RDW: 12.2 % (ref 11.5–15.5)
WBC: 6.6 10*3/uL (ref 4.0–10.5)

## 2016-08-07 LAB — BASIC METABOLIC PANEL
BUN: 13 mg/dL (ref 6–23)
CO2: 28 mEq/L (ref 19–32)
CREATININE: 1.12 mg/dL (ref 0.40–1.50)
Calcium: 9.8 mg/dL (ref 8.4–10.5)
Chloride: 102 mEq/L (ref 96–112)
GFR: 99 mL/min (ref >60.00)
Glucose, Bld: 97 mg/dL (ref 70–99)
POTASSIUM: 4.1 meq/L (ref 3.5–5.1)
Sodium: 137 mEq/L (ref 135–145)

## 2016-08-07 NOTE — Progress Notes (Signed)
Subjective:    Patient ID: Andrew Strickland, male    DOB: 1985/10/23, 31 y.o.   MRN: [id number]  HPI  Patient presents for yearly preventative medicine examination. He is a pleasant 31 year old male who  has no past medical history on file.  All immunizations and health maintenance protocols were reviewed with the patient and needed orders were placed.  Appropriate screening laboratory values were ordered for the patient including screening of hyperlipidemia, renal function and hepatic function.  Medication reconciliation,  past medical history, social history, problem list and allergies were reviewed in detail with the patient  Goals were established with regard to weight loss, exercise, and  diet in compliance with medications. He eats healthy and exercises frequently.   His only acute concern is that of possible syphilis. He reports that he first noticed a " chancre on the head of my penis" on June 2nd 2018. He denies any body rashes, fevers, or feeling ill. 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   HENT: Negative.   Eyes: Negative.   Respiratory: Negative.   Cardiovascular: Negative.   Gastrointestinal: Negative.   Endocrine: Negative.   Genitourinary: Negative.   Musculoskeletal: Negative.   Skin: Positive for color change.  Allergic/Immunologic: Negative.   Neurological: Negative.   Hematological: Negative.   Psychiatric/Behavioral: Negative.   All other systems reviewed and are negative.  No past medical history on file.  Social History   Social History  . Marital status: Single    Spouse name: N/A  . Number of children: N/A  . Years of education: N/A   Occupational History  . Not on file.   Social History Main Topics  . Smoking status: Never Smoker  . Smokeless tobacco: Never Used  . Alcohol use No  . Drug use: No    . Sexual activity: Yes    Birth control/ protection: Condom   Other Topics Concern  . 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  Family History  Problem Relation Age of Onset  . Family history unknown: Yes    Allergies  Allergen Reactions  . Codeine   . Penicillins     Current Outpatient Prescriptions on File Prior to Visit  Medication Sig Dispense Refill  . cetirizine (ZYRTEC) 10 MG tablet Take 10 mg by mouth daily.    . clindamycin (CLEOCIN T) 1 % external solution   11  . tretinoin (RETIN-A) 0.025 % cream   5   No current facility-administered medications on file prior to visit.     BP 138/80 (BP Location: Left Arm, Patient Position: Sitting, Cuff Size: Normal)   Temp 98.6 F (37 C) (Oral)   Ht 5\' 11"  (1.803 m)   Wt 189 lb 4.8 oz (85.9 kg)   BMI 26.40 kg/m       Objective:   Physical Exam  Constitutional: He is oriented to person, place, and time. He appears well-developed and well-nourished. No distress.  HENT:  Head: Normocephalic and atraumatic.  Right Ear: External ear normal.  Left Ear: External ear normal.  Nose: Nose normal.  Mouth/Throat: Oropharynx is clear and moist. No oropharyngeal exudate.  Eyes: Conjunctivae and EOM are normal. Pupils are equal, round, and reactive to light. Right eye exhibits no discharge. Left eye exhibits no discharge. No scleral icterus.  Neck: Normal range of motion. Neck supple. No JVD present. No tracheal deviation present. No thyromegaly present.  Cardiovascular: Normal rate, regular rhythm, normal heart sounds and intact distal pulses.  Exam reveals no gallop and no friction rub.   No murmur heard. Pulmonary/Chest: Effort normal and breath sounds normal. No stridor. No respiratory distress. He has no wheezes. He has no rales. He exhibits no tenderness.  Abdominal: Soft. Bowel sounds are normal. He exhibits no  distension and no mass. There is no tenderness. There is no rebound and no guarding.  Genitourinary:    Circumcised.  Musculoskeletal: Normal range of motion. He exhibits no edema, tenderness or deformity.  Lymphadenopathy:    He has no cervical adenopathy.  Neurological: He is alert and oriented to person, place, and time. No cranial nerve deficit. Coordination normal.  Skin: Skin is warm and dry. No rash noted. He is not diaphoretic. No erythema. No pallor.  Psychiatric: He has a normal mood and affect. Judgment and thought content normal.  Nursing note and vitals reviewed.      Assessment & Plan:  1. Routine general medical examination at a health care facility - Healthy male. Benign exam  - Basic metabolic panel - CBC with Differential/Platelet - Lipid panel - Hepatic function panel  2. Possible exposure to STD - Does not appear as syphilis. Papule appears more as molluscum. Patient was agreeable to cryotherapy  - Acute Hep Panel & Hep B Surface Ab - HIV antibody - Urine cytology ancillary only - RPR - HSV(herpes smplx)abs-1+2(IgG+IgM)-bld - Follow up as needed   Shirline Freesory Ember Gottwald, NP

## 2016-08-08 LAB — URINE CYTOLOGY ANCILLARY ONLY
CHLAMYDIA, DNA PROBE: NEGATIVE
Neisseria Gonorrhea: NEGATIVE
Trichomonas: NEGATIVE

## 2016-08-08 LAB — ACUTE HEP PANEL AND HEP B SURFACE AB
HCV Ab: NEGATIVE
HEP A IGM: NONREACTIVE
HEP B S AB: POSITIVE — AB
Hep B C IgM: NONREACTIVE
Hepatitis B Surface Ag: NEGATIVE

## 2016-08-08 LAB — HIV ANTIBODY (ROUTINE TESTING W REFLEX): HIV: NONREACTIVE

## 2016-08-08 LAB — RPR

## 2016-08-09 ENCOUNTER — Encounter: Payer: Self-pay | Admitting: Adult Health

## 2016-08-12 LAB — HSV(HERPES SMPLX)ABS-I+II(IGG+IGM)-BLD
HSV 1 Glycoprotein G Ab, IgG: 43.6 index — ABNORMAL HIGH (ref 0.00–0.90)
HSV 2 Glycoprotein G Ab, IgG: <0.91 index (ref 0.00–0.90)
HSVI/II Comb IgM: 0.97 Ratio — ABNORMAL HIGH (ref 0.00–0.90)

## 2016-10-01 MED FILL — CLINDAMYCIN PH 1% SOLUTION: 1 | 30 days supply | Qty: 60 | Fill #3

## 2016-11-07 ENCOUNTER — Encounter: Payer: Self-pay | Admitting: Adult Health

## 2017-06-24 ENCOUNTER — Encounter: Payer: Self-pay | Admitting: Adult Health

## 2017-06-24 ENCOUNTER — Ambulatory Visit (INDEPENDENT_AMBULATORY_CARE_PROVIDER_SITE_OTHER): Payer: BLUE CROSS/BLUE SHIELD | Admitting: Adult Health

## 2017-06-24 ENCOUNTER — Other Ambulatory Visit: Payer: Self-pay | Admitting: Adult Health

## 2017-06-24 VITALS — BP 126/74 | Temp 98.2°F | Ht 70.5 in | Wt 190.0 lb

## 2017-06-24 DIAGNOSIS — Z0000 Encounter for general adult medical examination without abnormal findings: Secondary | ICD-10-CM

## 2017-06-24 LAB — HEPATIC FUNCTION PANEL
ALBUMIN: 4.4 g/dL (ref 3.5–5.2)
ALK PHOS: 66 U/L (ref 39–117)
ALT: 36 U/L (ref 0–53)
AST: 28 U/L (ref 0–37)
BILIRUBIN DIRECT: 0.1 mg/dL (ref 0.0–0.3)
BILIRUBIN TOTAL: 0.3 mg/dL (ref 0.2–1.2)
TOTAL PROTEIN: 8 g/dL (ref 6.0–8.3)

## 2017-06-24 LAB — CBC WITH DIFFERENTIAL/PLATELET
Basophils Absolute: 0 10*3/uL (ref 0.0–0.1)
Basophils Relative: 0.3 % (ref 0.0–3.0)
EOS ABS: 0 10*3/uL (ref 0.0–0.7)
EOS PCT: 0.4 % (ref 0.0–5.0)
HCT: 41.5 % (ref 39.0–52.0)
HEMOGLOBIN: 14.3 g/dL (ref 13.0–17.0)
LYMPHS ABS: 1.9 10*3/uL (ref 0.7–4.0)
Lymphocytes Relative: 28 % (ref 12.0–46.0)
MCHC: 34.4 g/dL (ref 30.0–36.0)
MCV: 90.8 fl (ref 78.0–100.0)
MONO ABS: 0.4 10*3/uL (ref 0.1–1.0)
Monocytes Relative: 6.3 % (ref 3.0–12.0)
NEUTROS ABS: 4.3 10*3/uL (ref 1.4–7.7)
NEUTROS PCT: 65 % (ref 43.0–77.0)
PLATELETS: 361 10*3/uL (ref 150.0–400.0)
RBC: 4.57 Mil/uL (ref 4.22–5.81)
RDW: 12.4 % (ref 11.5–15.5)
WBC: 6.6 10*3/uL (ref 4.0–10.5)

## 2017-06-24 LAB — LIPID PANEL
CHOL/HDL RATIO: 3
Cholesterol: 163 mg/dL (ref 0–200)
HDL: 48.3 mg/dL (ref >39.00)
LDL Cholesterol: 100 mg/dL — ABNORMAL HIGH (ref 0–99)
NONHDL: 114.24
Triglycerides: 72 mg/dL (ref 0.0–149.0)
VLDL: 14.4 mg/dL (ref 0.0–40.0)

## 2017-06-24 LAB — BASIC METABOLIC PANEL
BUN: 21 mg/dL (ref 6–23)
CHLORIDE: 102 meq/L (ref 96–112)
CO2: 27 meq/L (ref 19–32)
CREATININE: 1.24 mg/dL (ref 0.40–1.50)
Calcium: 9.8 mg/dL (ref 8.4–10.5)
GFR: 87.52 mL/min (ref >60.00)
GLUCOSE: 79 mg/dL (ref 70–99)
POTASSIUM: 4.2 meq/L (ref 3.5–5.1)
Sodium: 137 mEq/L (ref 135–145)

## 2017-06-24 LAB — TSH: TSH: 1.34 u[IU]/mL (ref 0.35–4.50)

## 2017-06-24 NOTE — Progress Notes (Signed)
Subjective:    Patient ID: Andrew Strickland, male    DOB: 02/18/1985, 31 y.o.   MRN: [id number]  HPI  Patient presents for yearly preventative medicine examination. He is a pleasant and very health 31 year old male who  has no past medical history on file.  All immunizations and health maintenance protocols were reviewed with the patient and needed orders were placed. UTD on vaccinations   Appropriate screening laboratory values were ordered for the patient including screening of hyperlipidemia, renal function and hepatic function.  Medication reconciliation,  past medical history, social history, problem list and allergies were reviewed in detail with the patient  Goals were established with regard to weight loss, exercise, and  diet in compliance with medications. He eats healthy and exercises on a routine basis.   He has no acute complaints.   Review of Systems  Constitutional: Negative.   HENT: Negative.   Eyes: Negative.   Respiratory: Negative.   Cardiovascular: Negative.   Gastrointestinal: Negative.   Endocrine: Negative.   Genitourinary: Negative.   Musculoskeletal: Negative.   Skin: Negative.   Allergic/Immunologic: Negative.   Neurological: Negative.   Hematological: Negative.   Psychiatric/Behavioral: Negative.   All other systems reviewed and are negative.  History reviewed. No pertinent past medical history.  Social History   Socioeconomic History  . Marital status: Single    Spouse name: Not on file  . Number of children: Not on file  . Years of education: Not on file  . Highest education level: Not on file  Occupational History  . Not on file  Social Needs  . Financial resource strain: Not on file  . Food insecurity:    Worry: Not on file    Inability: Not on file  . Transportation needs:    Medical: Not on file    Non-medical: Not on file  Tobacco Use  . Smoking status: Never Smoker  . Smokeless tobacco: Never Used  Substance and Sexual  Activity  . Alcohol use: No    Alcohol/week: 0.0 oz  . Drug use: No  . Sexual activity: Yes    Birth control/protection: Condom  Lifestyle  . Physical activity:    Days per week: Not on file    Minutes per session: Not on file  . Stress: Not on file  Relationships  . Social connections:    Talks on phone: Not on file    Gets together: Not on file    Attends religious service: Not on file    Active member of club or organization: Not on file    Attends meetings of clubs or organizations: Not on file    Relationship status: Not on file  . Intimate partner violence:    Fear of current or ex partner: Not on file    Emotionally abused: Not on file    Physically abused: Not on file    Forced sexual activity: Not on file  Other Topics Concern  . 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 No pertinent surgical history.  Family History  Family history unknown: Yes    Allergies  Allergen Reactions  . Codeine   . Penicillins     Current Outpatient Medications on File Prior to Visit  Medication Sig Dispense Refill  . cetirizine (ZYRTEC) 10 MG tablet Take 10 mg by mouth daily.    Marland Kitchen  clindamycin (CLEOCIN T) 1 % external solution   11  . tretinoin (RETIN-A) 0.025 % cream   5   No current facility-administered medications on file prior to visit.     BP 126/74   Temp 98.2 F (36.8 C) (Oral)   Ht 5' 10.5" (1.791 m) Comment: WITH SHOES  Wt 190 lb (86.2 kg)   BMI 26.88 kg/m       Objective:   Physical Exam  Constitutional: He is oriented to person, place, and time. He appears well-developed and well-nourished. No distress.  HENT:  Head: Normocephalic and atraumatic.  Right Ear: External ear normal.  Left Ear: External ear normal.  Nose: Nose normal.  Mouth/Throat: Oropharynx is clear and moist. No oropharyngeal exudate.  Eyes: Pupils are equal, round, and reactive to light. Conjunctivae and  EOM are normal. Right eye exhibits no discharge. Left eye exhibits no discharge. No scleral icterus.  Neck: No JVD present. No tracheal deviation present. No thyromegaly present.  Cardiovascular: Normal rate, regular rhythm, normal heart sounds and intact distal pulses. Exam reveals no gallop and no friction rub.  No murmur heard. Pulmonary/Chest: Effort normal and breath sounds normal. No respiratory distress. He has no wheezes. He has no rales. He exhibits no tenderness.  Abdominal: Soft. Bowel sounds are normal. He exhibits no distension and no mass. There is no tenderness. There is no rebound and no guarding. No hernia.  Musculoskeletal: Normal range of motion. He exhibits no edema, tenderness or deformity.  Lymphadenopathy:    He has no cervical adenopathy.  Neurological: He is alert and oriented to person, place, and time. He displays normal reflexes. No cranial nerve deficit or sensory deficit. He exhibits normal muscle tone. Coordination normal.  Skin: Skin is warm and dry. Capillary refill takes less than 2 seconds. No rash noted. He is not diaphoretic. No erythema. No pallor.  Psychiatric: He has a normal mood and affect. His behavior is normal. Judgment and thought content normal.  Nursing note and vitals reviewed.     Assessment & Plan:  1. Routine general medical examination at a health care facility - Benign exam. Healthy young man  - Basic metabolic panel - CBC with Differential/Platelet - Hepatic function panel - Lipid panel - TSH  Shirline Frees, NP

## 2017-08-21 ENCOUNTER — Encounter: Payer: Self-pay | Admitting: Family Medicine

## 2017-08-21 ENCOUNTER — Ambulatory Visit (INDEPENDENT_AMBULATORY_CARE_PROVIDER_SITE_OTHER): Payer: BLUE CROSS/BLUE SHIELD | Admitting: Family Medicine

## 2017-08-21 VITALS — BP 130/81 | HR 67 | Ht 71.0 in | Wt 192.0 lb

## 2017-08-21 DIAGNOSIS — M25512 Pain in left shoulder: Secondary | ICD-10-CM | POA: Insufficient documentation

## 2017-08-21 NOTE — Progress Notes (Signed)
PCP: Shirline Frees, NP  Subjective:   HPI: Patient is a 32 y.o. male here for left shoulder pain.  Patient reports a year ago he felt like he slept on his left shoulder wrong but didn't sustain an acute injury. Seemed to improve from this. Then in April while training he noticed when doing overhead presses his left shoulder fatigues faster and he develops an achy, throbbing pain posterior left scapular area. Pain worse if he tries to sleep on left side and at night. No numbness, skin changes. Tried bodyworks, cupping treatments - help temporarily. No remote injury either. Pain level 1/10 currently. Right handed.  History reviewed. No pertinent past medical history.  Current Outpatient Medications on File Prior to Visit  Medication Sig Dispense Refill  . cetirizine (ZYRTEC) 10 MG tablet Take 10 mg by mouth daily.    . clindamycin (CLEOCIN T) 1 % external solution   11  . tretinoin (RETIN-A) 0.025 % cream   5   No current facility-administered medications on file prior to visit.     History reviewed. No pertinent surgical history.  Allergies  Allergen Reactions  . Codeine   . Penicillins     Social History   Socioeconomic History  . Marital status: Single    Spouse name: Not on file  . Number of children: Not on file  . Years of education: Not on file  . Highest education level: Not on file  Occupational History  . Not on file  Social Needs  . Financial resource strain: Not on file  . Food insecurity:    Worry: Not on file    Inability: Not on file  . Transportation needs:    Medical: Not on file    Non-medical: Not on file  Tobacco Use  . Smoking status: Never Smoker  . Smokeless tobacco: Never Used  Substance and Sexual Activity  . Alcohol use: No    Alcohol/week: 0.0 oz  . Drug use: No  . Sexual activity: Yes    Birth control/protection: Condom  Lifestyle  . Physical activity:    Days per week: Not on file    Minutes per session: Not on file  .  Stress: Not on file  Relationships  . Social connections:    Talks on phone: Not on file    Gets together: Not on file    Attends religious service: Not on file    Active member of club or organization: Not on file    Attends meetings of clubs or organizations: Not on file    Relationship status: Not on file  . Intimate partner violence:    Fear of current or ex partner: Not on file    Emotionally abused: Not on file    Physically abused: Not on file    Forced sexual activity: Not on file  Other Topics Concern  . 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     Objective:  Physical Exam:  Gen: NAD, comfortable in exam room  Left shoulder: No swelling, ecchymoses.  No gross deformity. No TTP. FROM. Negative Hawkins, Neers. Negative Yergasons. Strength 5/5  with empty can and resisted internal/external rotation.  Pain empty can. Negative apprehension. Positive o'briens. NV intact distally.  Right shoulder: No swelling, ecchymoses.  No gross deformity. No TTP. FROM. Strength 5/5 with empty can and resisted internal/external rotation. NV intact distally.   Assessment & Plan:  1. Left shoulder pain - concerning for labral tear with possible paralabral cyst.  Exam otherwise reassuring.  No atrophy or evidence of suprascapular nerve impingement.  Start physical therapy with labral tear protocol.  Tylenol, icing only if needed.  F/u in 6 weeks.  Consider MRI arthrogram if not improving.

## 2017-08-21 NOTE — Patient Instructions (Signed)
Your history and exam are consistent with a labral tear of your shoulder. Start physical therapy and do home exercises on days you don't go to therapy. They will review a progression of exercises - in the meantime I'd avoid overhead presses, incline bench - use pain as your guide for other exercises. Tylenol, icing only if needed. Follow up with me in 6 weeks. If not more than 50% improved by that point I'd consider an MRI arthrogram.

## 2017-08-21 NOTE — Assessment & Plan Note (Signed)
concerning for labral tear with possible paralabral cyst.  Exam otherwise reassuring.  No atrophy or evidence of suprascapular nerve impingement.  Start physical therapy with labral tear protocol.  Tylenol, icing only if needed.  F/u in 6 weeks.  Consider MRI arthrogram if not improving.

## 2017-10-02 ENCOUNTER — Ambulatory Visit: Payer: BLUE CROSS/BLUE SHIELD | Admitting: Family Medicine

## 2018-02-08 DIAGNOSIS — M25512 Pain in left shoulder: Secondary | ICD-10-CM | POA: Insufficient documentation

## 2018-04-09 ENCOUNTER — Encounter: Payer: Managed Care, Other (non HMO) | Admitting: Adult Health

## 2018-04-21 ENCOUNTER — Ambulatory Visit (INDEPENDENT_AMBULATORY_CARE_PROVIDER_SITE_OTHER): Payer: Managed Care, Other (non HMO) | Admitting: Adult Health

## 2018-04-21 ENCOUNTER — Ambulatory Visit (INDEPENDENT_AMBULATORY_CARE_PROVIDER_SITE_OTHER): Payer: Managed Care, Other (non HMO)

## 2018-04-21 ENCOUNTER — Encounter: Payer: Self-pay | Admitting: Adult Health

## 2018-04-21 VITALS — BP 122/70 | Temp 98.1°F | Wt 202.0 lb

## 2018-04-21 DIAGNOSIS — Z01818 Encounter for other preprocedural examination: Secondary | ICD-10-CM

## 2018-04-21 DIAGNOSIS — Z111 Encounter for screening for respiratory tuberculosis: Secondary | ICD-10-CM | POA: Diagnosis not present

## 2018-04-21 LAB — BASIC METABOLIC PANEL
BUN: 14 mg/dL (ref 6–23)
CO2: 30 mEq/L (ref 19–32)
CREATININE: 1.24 mg/dL (ref 0.40–1.50)
Calcium: 9.9 mg/dL (ref 8.4–10.5)
Chloride: 101 mEq/L (ref 96–112)
GFR: 81.9 mL/min (ref >60.00)
Glucose, Bld: 88 mg/dL (ref 70–99)
Potassium: 4.4 mEq/L (ref 3.5–5.1)
Sodium: 138 mEq/L (ref 135–145)

## 2018-04-21 LAB — PROTIME-INR
INR: 1.1 ratio — AB (ref 0.8–1.0)
PROTHROMBIN TIME: 13.2 s — AB (ref 9.6–13.1)

## 2018-04-21 LAB — CBC WITH DIFFERENTIAL/PLATELET
Basophils Absolute: 0 10*3/uL (ref 0.0–0.1)
Basophils Relative: 0.2 % (ref 0.0–3.0)
EOS PCT: 0.3 % (ref 0.0–5.0)
Eosinophils Absolute: 0 10*3/uL (ref 0.0–0.7)
HEMATOCRIT: 42.2 % (ref 39.0–52.0)
Hemoglobin: 14.4 g/dL (ref 13.0–17.0)
LYMPHS PCT: 14.8 % (ref 12.0–46.0)
Lymphs Abs: 1.2 10*3/uL (ref 0.7–4.0)
MCHC: 34 g/dL (ref 30.0–36.0)
MCV: 90.8 fl (ref 78.0–100.0)
MONOS PCT: 10.1 % (ref 3.0–12.0)
Monocytes Absolute: 0.8 10*3/uL (ref 0.1–1.0)
NEUTROS PCT: 74.6 % (ref 43.0–77.0)
Neutro Abs: 6.1 10*3/uL (ref 1.4–7.7)
PLATELETS: 259 10*3/uL (ref 150.0–400.0)
RBC: 4.65 Mil/uL (ref 4.22–5.81)
RDW: 12 % (ref 11.5–15.5)
WBC: 8.1 10*3/uL (ref 4.0–10.5)

## 2018-04-21 NOTE — Patient Instructions (Signed)
It was great seeing you today.

## 2018-04-21 NOTE — Progress Notes (Signed)
Subjective:    Patient ID: Andrew Strickland, male    DOB: 1985-06-09, 33 y.o.   MRN: [id number]  HPI 33 year old male who  has no past medical history on file.  He presents to the office today for pre surgical clearance. He will be having having a labral tear in the left shoulder repaired on 04/27/2018.   He also needs a PPD done for school clinicals   He has no acute complains or concerns   Review of Systems See HPI   History reviewed. No pertinent past medical history.  Social History   Socioeconomic History  . Marital status: Single    Spouse name: Not on file  . Number of children: Not on file  . Years of education: Not on file  . Highest education level: Not on file  Occupational History  . Not on file  Social Needs  . Financial resource strain: Not on file  . Food insecurity:    Worry: Not on file    Inability: Not on file  . Transportation needs:    Medical: Not on file    Non-medical: Not on file  Tobacco Use  . Smoking status: Never Smoker  . Smokeless tobacco: Never Used  Substance and Sexual Activity  . Alcohol use: No    Alcohol/week: 0.0 standard drinks  . Drug use: No  . Sexual activity: Yes    Birth control/protection: Condom  Lifestyle  . Physical activity:    Days per week: Not on file    Minutes per session: Not on file  . Stress: Not on file  Relationships  . Social connections:    Talks on phone: Not on file    Gets together: Not on file    Attends religious service: Not on file    Active member of club or organization: Not on file    Attends meetings of clubs or organizations: Not on file    Relationship status: Not on file  . Intimate partner violence:    Fear of current or ex partner: Not on file    Emotionally abused: Not on file    Physically abused: Not on file    Forced sexual activity: Not on file  Other Topics Concern  . 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 No pertinent surgical history.  Family History  Family history unknown: Yes    Allergies  Allergen Reactions  . Codeine   . Penicillins     Current Outpatient Medications on File Prior to Visit  Medication Sig Dispense Refill  . cetirizine (ZYRTEC) 10 MG tablet Take 10 mg by mouth daily.    . clindamycin (CLEOCIN T) 1 % external solution   11  . tretinoin (RETIN-A) 0.025 % cream   5   No current facility-administered medications on file prior to visit.     BP 122/70   Temp 98.1 F (36.7 C)   Wt 202 lb (91.6 kg)   BMI 28.17 kg/m       Objective:   Physical Exam Vitals signs and nursing note reviewed.  Constitutional:      Appearance: Normal appearance.  Cardiovascular:     Rate and Rhythm: Normal rate and regular rhythm.     Pulses: Normal pulses.     Heart sounds: Normal heart sounds.  Pulmonary:  Effort: Pulmonary effort is normal.     Breath sounds: Normal breath sounds.  Abdominal:     General: Abdomen is flat. Bowel sounds are normal.     Palpations: Abdomen is soft.  Skin:    General: Skin is warm and dry.     Capillary Refill: Capillary refill takes less than 2 seconds.  Neurological:     General: No focal deficit present.     Mental Status: He is alert and oriented to person, place, and time.  Psychiatric:        Mood and Affect: Mood normal.        Behavior: Behavior normal.        Thought Content: Thought content normal.        Judgment: Judgment normal.       Assessment & Plan:  1. Pre-operative clearance - healthy male. Cleared for surgery  - CBC with Differential/Platelet - Basic Metabolic Panel - DG Chest 2 View; Future - Protime-INR  Shirline Frees, NP

## 2018-04-23 ENCOUNTER — Encounter: Payer: Self-pay | Admitting: Family Medicine

## 2018-04-23 LAB — TB SKIN TEST: TB SKIN TEST: NEGATIVE

## 2018-04-23 NOTE — Addendum Note (Signed)
Addended by: Raj Janus T on: 04/23/2018 02:13 PM   Modules accepted: Orders

## 2018-04-23 NOTE — Addendum Note (Signed)
Addended by: Raj Janus T on: 04/23/2018 02:52 PM   Modules accepted: Orders

## 2018-04-28 ENCOUNTER — Encounter: Payer: Self-pay | Admitting: Adult Health

## 2018-04-28 ENCOUNTER — Other Ambulatory Visit: Payer: Self-pay | Admitting: Adult Health

## 2018-04-28 DIAGNOSIS — Z111 Encounter for screening for respiratory tuberculosis: Secondary | ICD-10-CM

## 2018-04-29 ENCOUNTER — Other Ambulatory Visit: Payer: Self-pay

## 2018-04-29 ENCOUNTER — Other Ambulatory Visit (INDEPENDENT_AMBULATORY_CARE_PROVIDER_SITE_OTHER): Payer: Managed Care, Other (non HMO)

## 2018-04-29 DIAGNOSIS — Z111 Encounter for screening for respiratory tuberculosis: Secondary | ICD-10-CM

## 2018-05-01 LAB — QUANTIFERON-TB GOLD PLUS
Mitogen-NIL: 7.58 IU/mL
NIL: 0.03 [IU]/mL
QuantiFERON-TB Gold Plus: NEGATIVE
TB1-NIL: 0.06 IU/mL
TB2-NIL: 0.13 IU/mL

## 2018-05-02 ENCOUNTER — Encounter: Payer: Self-pay | Admitting: Adult Health

## 2018-05-03 ENCOUNTER — Encounter: Payer: Self-pay | Admitting: Family Medicine

## 2018-07-19 ENCOUNTER — Other Ambulatory Visit: Payer: Self-pay

## 2018-07-19 ENCOUNTER — Ambulatory Visit (INDEPENDENT_AMBULATORY_CARE_PROVIDER_SITE_OTHER): Payer: Managed Care, Other (non HMO)

## 2018-07-19 ENCOUNTER — Ambulatory Visit: Payer: Managed Care, Other (non HMO) | Admitting: Podiatry

## 2018-07-19 ENCOUNTER — Other Ambulatory Visit: Payer: Self-pay | Admitting: Podiatry

## 2018-07-19 ENCOUNTER — Encounter: Payer: Self-pay | Admitting: Podiatry

## 2018-07-19 VITALS — Temp 98.1°F

## 2018-07-19 DIAGNOSIS — M79671 Pain in right foot: Secondary | ICD-10-CM

## 2018-07-19 DIAGNOSIS — M79672 Pain in left foot: Secondary | ICD-10-CM

## 2018-07-19 DIAGNOSIS — M779 Enthesopathy, unspecified: Secondary | ICD-10-CM

## 2018-07-21 NOTE — Progress Notes (Signed)
Subjective:   Patient ID: Andrew Strickland, male   DOB: 33 y.o.   MRN: [id number]   HPI Patient presents with flatfoot deformity bilateral with history of orthotics which were beneficial and is lost his last pair and started developed increased discomfort.  Patient does not smoke likes to be active and states that this does affect his ability to be active without any kind of support   Review of Systems  All other systems reviewed and are negative.       Objective:  Physical Exam Vitals signs and nursing note reviewed.  Constitutional:      Appearance: He is well-developed.  Pulmonary:     Effort: Pulmonary effort is normal.  Musculoskeletal: Normal range of motion.  Skin:    General: Skin is warm.  Neurological:     Mental Status: He is alert.     Neurovascular status found to be intact muscle strength is adequate range of motion was within normal limits with moderate flatfoot deformity and stress on the posterior tibial tendon bilateral.  Patient has good digital perfusion well oriented x3     Assessment:  Chronic foot structural changes creating stress against both feet     Plan:  H&P x-rays reviewed and recommended new orthotics and casted for functional orthotics utilizing ped orthotist for this.  Patient will be seen back when ready  X-rays indicate that the patient's structure looks good with no indications of pathology

## 2018-08-02 ENCOUNTER — Other Ambulatory Visit: Payer: Self-pay

## 2018-08-02 ENCOUNTER — Ambulatory Visit: Payer: Managed Care, Other (non HMO) | Admitting: Orthotics

## 2018-08-02 DIAGNOSIS — M79671 Pain in right foot: Secondary | ICD-10-CM

## 2018-08-02 DIAGNOSIS — M779 Enthesopathy, unspecified: Secondary | ICD-10-CM

## 2018-08-02 NOTE — Progress Notes (Signed)
Patient came in today to p/up functional foot orthotics.   The orthotics were assessed to both fit and function.  The F/O addressed the biomechanical issues/pathologies as intended, offering good longitudinal arch support, proper offloading, and foot support. There weren't any signs of discomfort or irritation.  The F/O fit properly in footwear with minimal trimming/adjustments. 

## 2019-06-27 ENCOUNTER — Ambulatory Visit (INDEPENDENT_AMBULATORY_CARE_PROVIDER_SITE_OTHER): Payer: 59 | Admitting: Orthotics

## 2019-06-27 ENCOUNTER — Other Ambulatory Visit: Payer: Self-pay

## 2019-06-27 DIAGNOSIS — M79672 Pain in left foot: Secondary | ICD-10-CM

## 2019-06-27 DIAGNOSIS — M79671 Pain in right foot: Secondary | ICD-10-CM | POA: Diagnosis not present

## 2019-06-27 NOTE — Progress Notes (Signed)
Repeating order for cmfo.  Dress this time.

## 2020-09-27 IMAGING — DX DG CHEST 2V
2 series · 2 of 2 positions shown · non-contrast
Comparison: None.

CLINICAL DATA: Preop clearance.

EXAM:
CHEST - 2 VIEW

[chest pa]
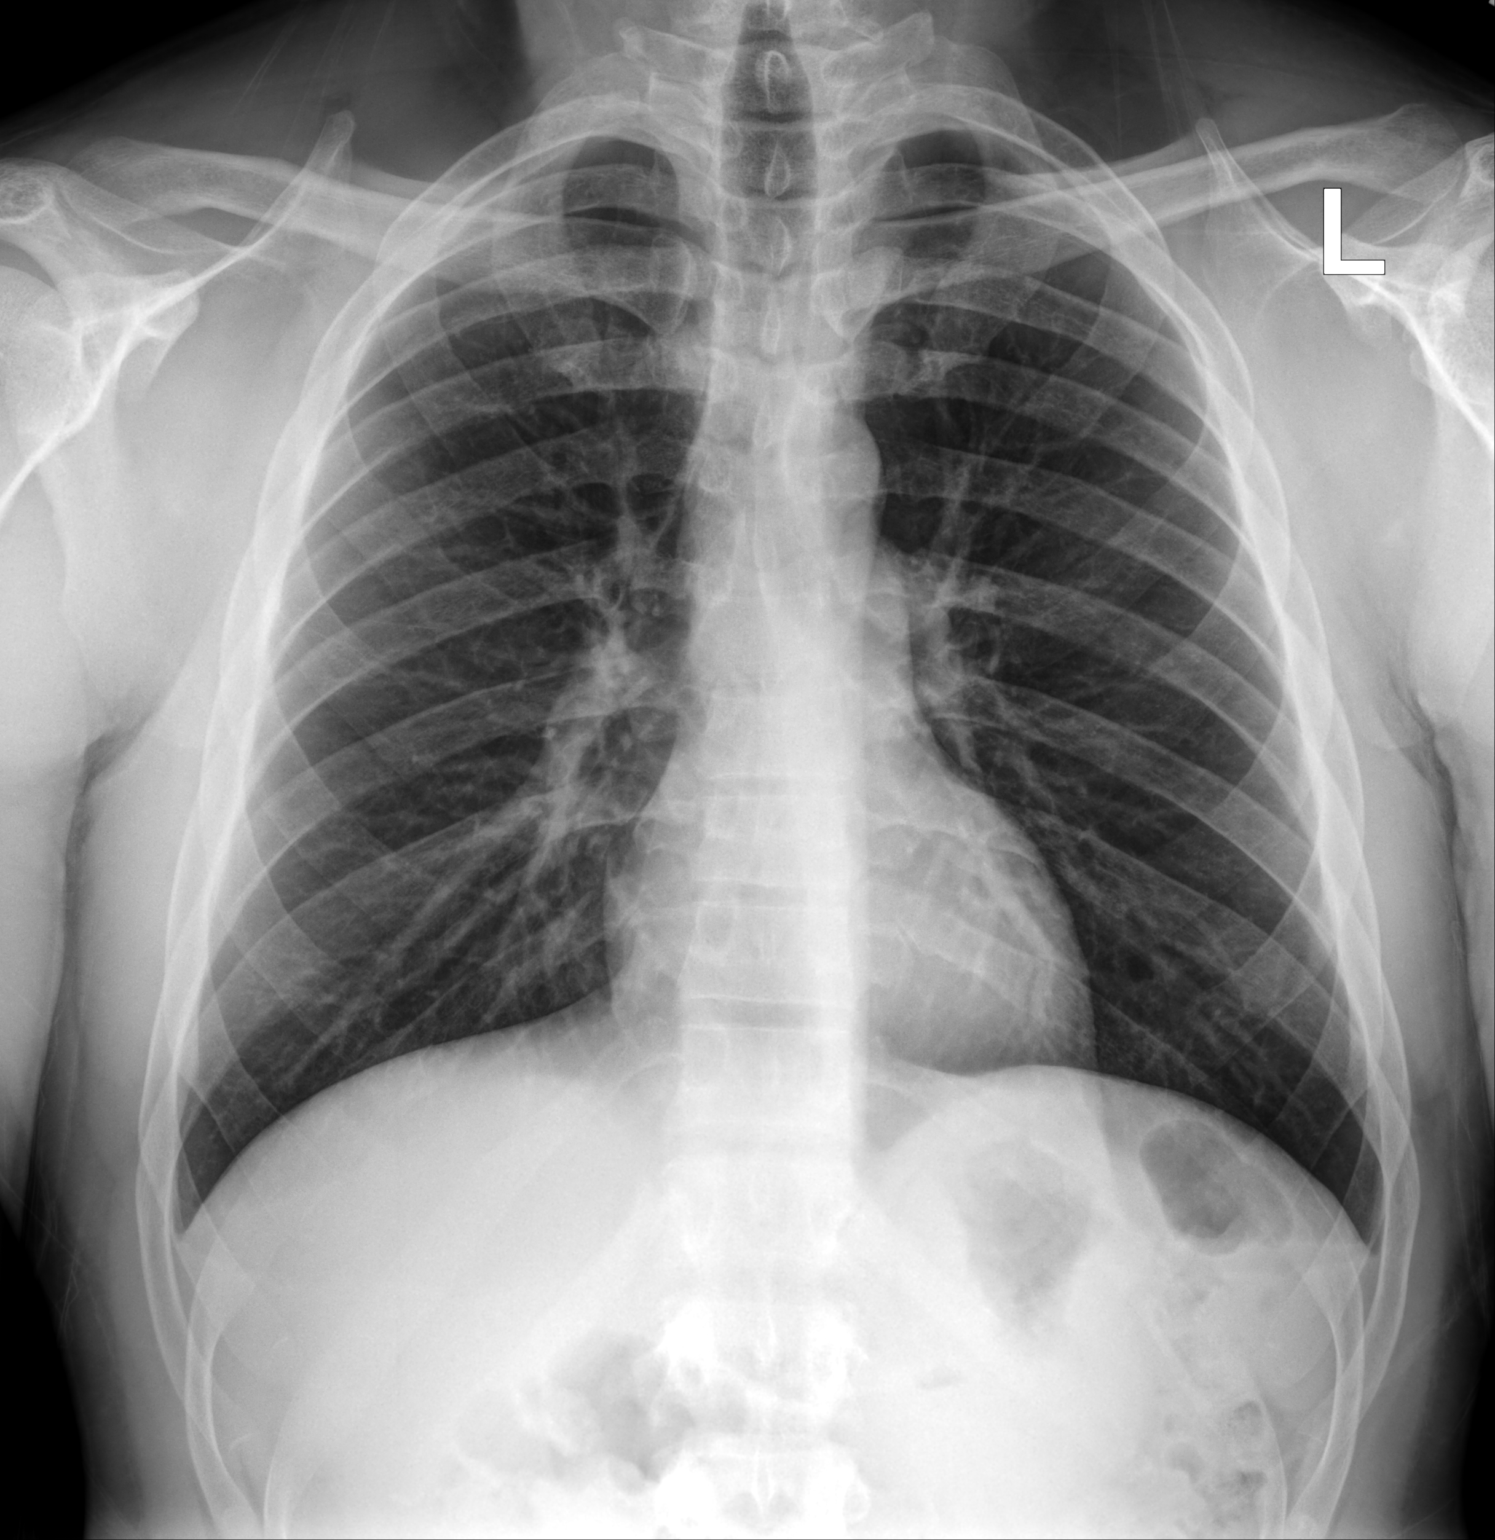

[chest lat]
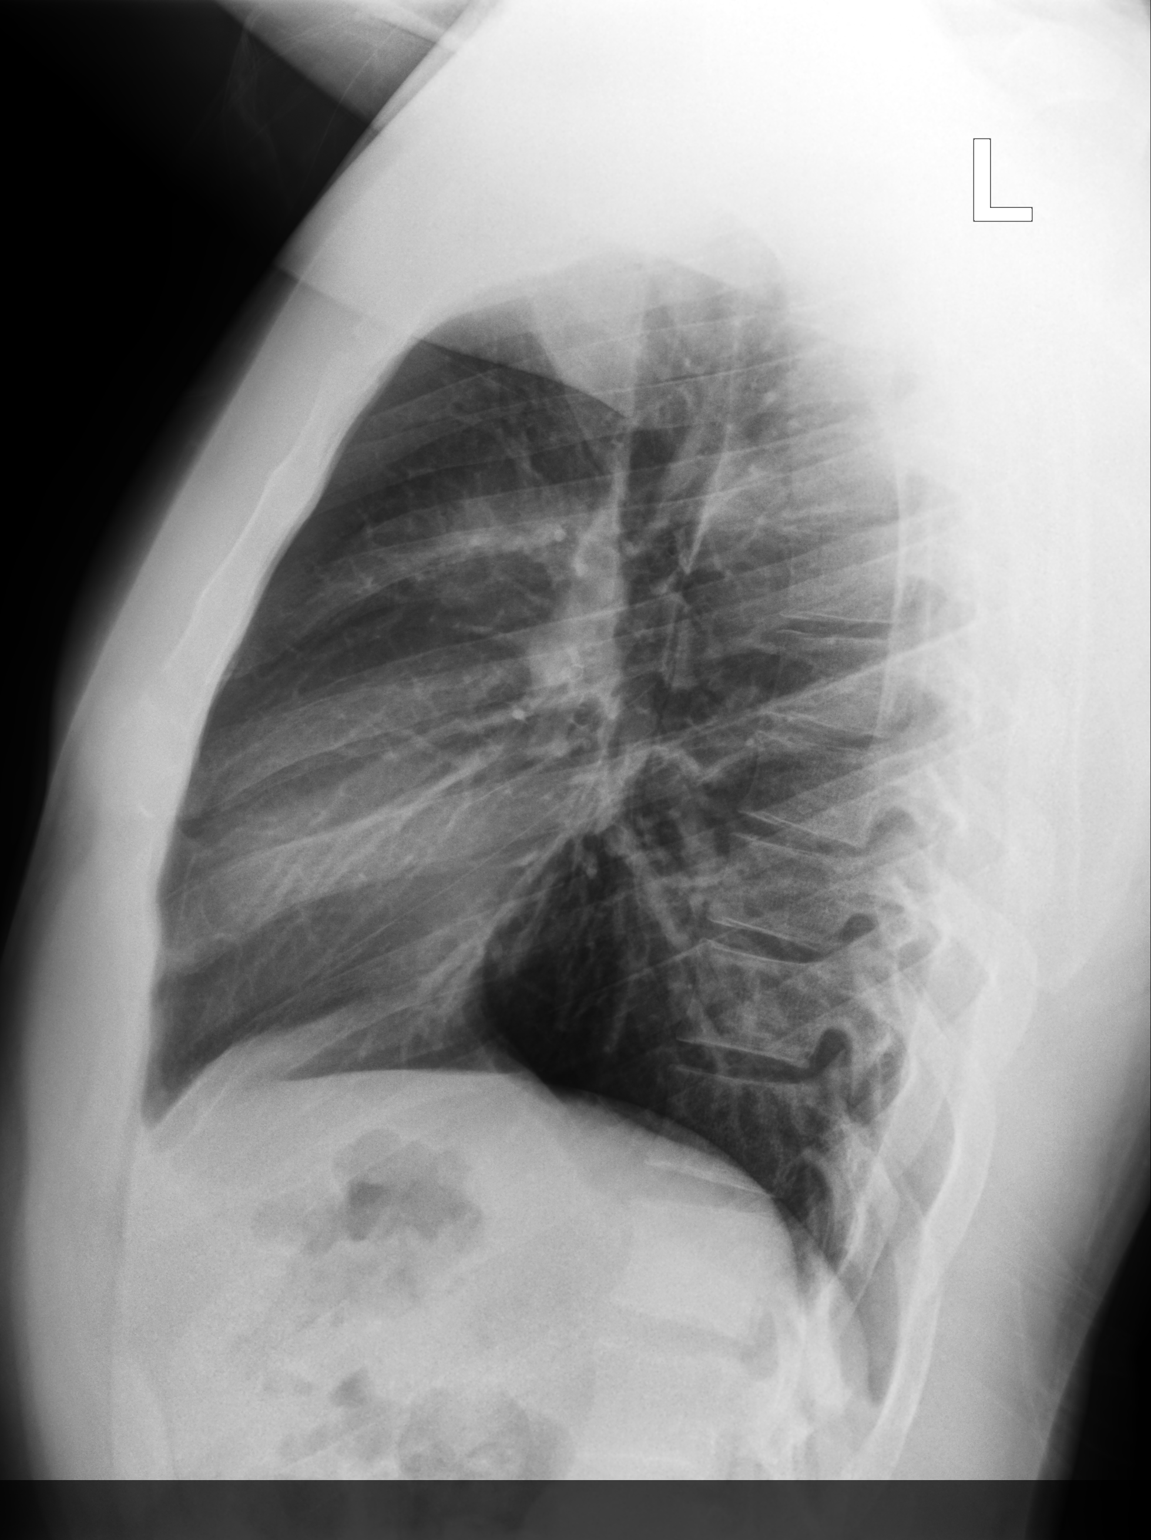

[2 of 2 positions shown; findings below may reference images not displayed]

FINDINGS: The cardiomediastinal contours are normal. The lungs are clear.
Pulmonary vasculature is normal. No consolidation, pleural effusion,
or pneumothorax. No acute osseous abnormalities are seen.
IMPRESSION: Normal radiographs of the chest.
